# Patient Record
Sex: Female | Born: 1975 | Race: White | Hispanic: No | Marital: Single | Smoking: Current every day smoker
Health system: Southern US, Community
[De-identification: ages and names within clinical notes are randomized; demographics above are authoritative.]

## PROBLEM LIST (undated history)

## (undated) DIAGNOSIS — E119 Type 2 diabetes mellitus without complications: Secondary | ICD-10-CM

## (undated) DIAGNOSIS — I079 Rheumatic tricuspid valve disease, unspecified: Secondary | ICD-10-CM

## (undated) DIAGNOSIS — K219 Gastro-esophageal reflux disease without esophagitis: Secondary | ICD-10-CM

## (undated) DIAGNOSIS — E079 Disorder of thyroid, unspecified: Secondary | ICD-10-CM

## (undated) DIAGNOSIS — M726 Necrotizing fasciitis: Secondary | ICD-10-CM

## (undated) DIAGNOSIS — G894 Chronic pain syndrome: Secondary | ICD-10-CM

## (undated) DIAGNOSIS — G629 Polyneuropathy, unspecified: Secondary | ICD-10-CM

## (undated) HISTORY — PX: TONSILLECTOMY: SUR1361

---

## 2019-07-27 ENCOUNTER — Emergency Department (HOSPITAL_BASED_OUTPATIENT_CLINIC_OR_DEPARTMENT_OTHER): Payer: Medicaid Other

## 2019-07-27 ENCOUNTER — Other Ambulatory Visit: Payer: Self-pay

## 2019-07-27 ENCOUNTER — Emergency Department (HOSPITAL_BASED_OUTPATIENT_CLINIC_OR_DEPARTMENT_OTHER)
Admission: EM | Admit: 2019-07-27 | Discharge: 2019-07-27 | Disposition: A | Discharge status: Other Acute Inpatient Hospital | Payer: Medicaid Other | Attending: Emergency Medicine | Admitting: Emergency Medicine

## 2019-07-27 ENCOUNTER — Encounter (HOSPITAL_BASED_OUTPATIENT_CLINIC_OR_DEPARTMENT_OTHER): Payer: Self-pay | Admitting: *Deleted

## 2019-07-27 DIAGNOSIS — Z794 Long term (current) use of insulin: Secondary | ICD-10-CM | POA: Insufficient documentation

## 2019-07-27 DIAGNOSIS — M726 Necrotizing fasciitis: Secondary | ICD-10-CM | POA: Diagnosis not present

## 2019-07-27 DIAGNOSIS — M79631 Pain in right forearm: Secondary | ICD-10-CM | POA: Diagnosis present

## 2019-07-27 DIAGNOSIS — L02413 Cutaneous abscess of right upper limb: Secondary | ICD-10-CM | POA: Insufficient documentation

## 2019-07-27 DIAGNOSIS — A419 Sepsis, unspecified organism: Secondary | ICD-10-CM | POA: Insufficient documentation

## 2019-07-27 DIAGNOSIS — Z79899 Other long term (current) drug therapy: Secondary | ICD-10-CM | POA: Insufficient documentation

## 2019-07-27 DIAGNOSIS — F17200 Nicotine dependence, unspecified, uncomplicated: Secondary | ICD-10-CM | POA: Diagnosis not present

## 2019-07-27 DIAGNOSIS — L0291 Cutaneous abscess, unspecified: Secondary | ICD-10-CM

## 2019-07-27 DIAGNOSIS — Z20828 Contact with and (suspected) exposure to other viral communicable diseases: Secondary | ICD-10-CM | POA: Diagnosis not present

## 2019-07-27 DIAGNOSIS — M60031 Infective myositis, right forearm: Secondary | ICD-10-CM

## 2019-07-27 DIAGNOSIS — E119 Type 2 diabetes mellitus without complications: Secondary | ICD-10-CM | POA: Insufficient documentation

## 2019-07-27 HISTORY — DX: Necrotizing fasciitis: M72.6

## 2019-07-27 HISTORY — DX: Disorder of thyroid, unspecified: E07.9

## 2019-07-27 HISTORY — DX: Rheumatic tricuspid valve disease, unspecified: I07.9

## 2019-07-27 HISTORY — DX: Type 2 diabetes mellitus without complications: E11.9

## 2019-07-27 HISTORY — DX: Chronic pain syndrome: G89.4

## 2019-07-27 HISTORY — DX: Gastro-esophageal reflux disease without esophagitis: K21.9

## 2019-07-27 HISTORY — DX: Polyneuropathy, unspecified: G62.9

## 2019-07-27 LAB — CBC WITH DIFFERENTIAL/PLATELET
Abs Immature Granulocytes: 0.27 10*3/uL — ABNORMAL HIGH (ref 0.00–0.07)
Basophils Absolute: 0 10*3/uL (ref 0.0–0.1)
Basophils Relative: 0 %
Eosinophils Absolute: 0.1 10*3/uL (ref 0.0–0.5)
Eosinophils Relative: 1 %
HCT: 39.9 % (ref 36.0–46.0)
Hemoglobin: 13.7 g/dL (ref 12.0–15.0)
Immature Granulocytes: 1 %
Lymphocytes Relative: 6 %
Lymphs Abs: 1.2 10*3/uL (ref 0.7–4.0)
MCH: 29.8 pg (ref 26.0–34.0)
MCHC: 34.3 g/dL (ref 30.0–36.0)
MCV: 86.9 fL (ref 80.0–100.0)
Monocytes Absolute: 1.2 10*3/uL — ABNORMAL HIGH (ref 0.1–1.0)
Monocytes Relative: 6 %
Neutro Abs: 18.2 10*3/uL — ABNORMAL HIGH (ref 1.7–7.7)
Neutrophils Relative %: 86 %
Platelets: 390 10*3/uL (ref 150–400)
RBC: 4.59 MIL/uL (ref 3.87–5.11)
RDW: 12.8 % (ref 11.5–15.5)
WBC: 21 10*3/uL — ABNORMAL HIGH (ref 4.0–10.5)
nRBC: 0 % (ref 0.0–0.2)

## 2019-07-27 LAB — URINALYSIS, ROUTINE W REFLEX MICROSCOPIC
Bilirubin Urine: NEGATIVE
Glucose, UA: >=500 mg/dL — AB
Ketones, ur: NEGATIVE mg/dL
Leukocytes,Ua: NEGATIVE
Nitrite: NEGATIVE
Protein, ur: 30 mg/dL — AB
Specific Gravity, Urine: <1.005 — ABNORMAL LOW (ref 1.005–1.030)
pH: 6 (ref 5.0–8.0)

## 2019-07-27 LAB — CBG MONITORING, ED: Glucose-Capillary: 317 mg/dL — ABNORMAL HIGH (ref 70–99)

## 2019-07-27 LAB — RESPIRATORY PANEL BY RT PCR (FLU A&B, COVID)
Influenza A by PCR: NEGATIVE
Influenza B by PCR: NEGATIVE
SARS Coronavirus 2 by RT PCR: NEGATIVE

## 2019-07-27 LAB — URINALYSIS, MICROSCOPIC (REFLEX): WBC, UA: NONE SEEN WBC/hpf (ref 0–5)

## 2019-07-27 LAB — COMPREHENSIVE METABOLIC PANEL
ALT: 17 U/L (ref 0–44)
AST: 21 U/L (ref 15–41)
Albumin: 3 g/dL — ABNORMAL LOW (ref 3.5–5.0)
Alkaline Phosphatase: 123 U/L (ref 38–126)
Anion gap: 11 (ref 5–15)
BUN: 13 mg/dL (ref 6–20)
CO2: 27 mmol/L (ref 22–32)
Calcium: 8.6 mg/dL — ABNORMAL LOW (ref 8.9–10.3)
Chloride: 87 mmol/L — ABNORMAL LOW (ref 98–111)
Creatinine, Ser: 0.8 mg/dL (ref 0.44–1.00)
GFR calc Af Amer: >60 mL/min (ref >60)
GFR calc non Af Amer: >60 mL/min (ref >60)
Glucose, Bld: 447 mg/dL — ABNORMAL HIGH (ref 70–99)
Potassium: 3.4 mmol/L — ABNORMAL LOW (ref 3.5–5.1)
Sodium: 125 mmol/L — ABNORMAL LOW (ref 135–145)
Total Bilirubin: 0.5 mg/dL (ref 0.3–1.2)
Total Protein: 7.5 g/dL (ref 6.5–8.1)

## 2019-07-27 LAB — LACTIC ACID, PLASMA
Lactic Acid, Venous: 2.7 mmol/L (ref 0.5–1.9)
Lactic Acid, Venous: 2.7 mmol/L (ref 0.5–1.9)

## 2019-07-27 LAB — PROTIME-INR
INR: 0.9 (ref 0.8–1.2)
Prothrombin Time: 12.2 seconds (ref 11.4–15.2)

## 2019-07-27 LAB — SARS CORONAVIRUS 2 AG (30 MIN TAT): SARS Coronavirus 2 Ag: NEGATIVE

## 2019-07-27 LAB — PREGNANCY, URINE: Preg Test, Ur: NEGATIVE

## 2019-07-27 LAB — APTT: aPTT: 40 seconds — ABNORMAL HIGH (ref 24–36)

## 2019-07-27 MED ORDER — MORPHINE SULFATE (PF) 4 MG/ML IV SOLN
4.0000 mg | Freq: Once | INTRAVENOUS | Status: AC
  Administered 2019-07-27: 4 mg via INTRAVENOUS
  Filled 2019-07-27: qty 1

## 2019-07-27 MED ORDER — VANCOMYCIN HCL IN DEXTROSE 1-5 GM/200ML-% IV SOLN: 1000.0000 mg | Freq: Two times a day (BID) | INTRAVENOUS | Status: DC

## 2019-07-27 MED ORDER — PIPERACILLIN-TAZOBACTAM 3.375 G IVPB 30 MIN
3.3750 g | Freq: Once | INTRAVENOUS | Status: AC
  Administered 2019-07-27: 3.375 g via INTRAVENOUS
  Filled 2019-07-27 (×2): qty 50

## 2019-07-27 MED ORDER — ACETAMINOPHEN 325 MG PO TABS
650.0000 mg | ORAL_TABLET | Freq: Once | ORAL | Status: AC
  Administered 2019-07-27: 650 mg via ORAL
  Filled 2019-07-27: qty 2

## 2019-07-27 MED ORDER — VANCOMYCIN HCL IN DEXTROSE 1-5 GM/200ML-% IV SOLN: 1000.0000 mg | Freq: Once | INTRAVENOUS | Status: DC

## 2019-07-27 MED ORDER — IOHEXOL 300 MG/ML  SOLN
100.0000 mL | Freq: Once | INTRAMUSCULAR | Status: AC | PRN
  Administered 2019-07-27: 18:00:00 100 mL via INTRAVENOUS

## 2019-07-27 MED ORDER — PIPERACILLIN-TAZOBACTAM 3.375 G IVPB: 3.3750 g | Freq: Three times a day (TID) | INTRAVENOUS | Status: DC

## 2019-07-27 MED ORDER — CLINDAMYCIN PHOSPHATE 900 MG/50ML IV SOLN
900.0000 mg | Freq: Once | INTRAVENOUS | Status: AC
  Administered 2019-07-27: 900 mg via INTRAVENOUS
  Filled 2019-07-27: qty 50

## 2019-07-27 MED ORDER — VANCOMYCIN HCL IN DEXTROSE 1-5 GM/200ML-% IV SOLN
1000.0000 mg | Freq: Once | INTRAVENOUS | Status: AC
  Administered 2019-07-27: 1000 mg via INTRAVENOUS
  Filled 2019-07-27: qty 200

## 2019-07-27 MED ORDER — VANCOMYCIN HCL 10 G IV SOLR
1250.0000 mg | Freq: Once | INTRAVENOUS | Status: DC
  Filled 2019-07-27: qty 1250

## 2019-07-27 MED ORDER — LACTATED RINGERS IV BOLUS (SEPSIS)
1000.0000 mL | Freq: Once | INTRAVENOUS | Status: AC
  Administered 2019-07-27: 1000 mL via INTRAVENOUS

## 2019-07-27 NOTE — ED Provider Notes (Signed)
[facility] EMERGENCY DEPARTMENT Provider Note   CSN: [id number] Arrival date & time: 07/27/19  1603     History No chief complaint on file.   Alexandra Rich is a 43 y.o. female.  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   Reports she scratched her right forearm on briars on Thursday. Friday, developed worsening pain and redness to the area which has significantly worsened.  Pain is severe, 10/10, worse with movement and palpation.  No fevers at home but reports chills, feeling cold.  No n/v/cough or other symptoms. Is having difficulty moving her right hand but cannot say for how long, reports has been progressive as pain and swelling have increased.  Reports she did not want to come to the ED as she does not want to be admitted and does not want to end up in a SNF again on IV antibiotics.  Glucose have been running high for last several days.      Past Medical History:  Diagnosis Date  . Chronic pain syndrome   . Diabetes mellitus without complication (HCC)   . Endocarditis of tricuspid valve   . GERD (gastroesophageal reflux disease)   . Necrotizing fasciitis (HCC)   . Neuropathy   . Thyroid disease     There are no problems to display for this patient.   Past Surgical History:  Procedure Laterality Date  . TONSILLECTOMY       OB History   No obstetric history on file.     No family history on file.  Social History   Tobacco Use  . Smoking status: Current Every Day Smoker  . Smokeless tobacco: Never Used    Substance Use Topics  . Alcohol use: Yes  . Drug use: Never    Home Medications Prior to Admission medications   Medication Sig Start Date End Date Taking? Authorizing Provider  atorvastatin (LIPITOR) 80 MG tablet Take by mouth. 10/16/17  Yes [provider]  fenofibrate (TRICOR) 145 MG tablet Take by mouth. 10/16/17  Yes [provider]  insulin aspart (NOVOLOG) 100 UNIT/ML injection 100 units MDD via VGo 04/01/19  Yes [provider]  levothyroxine (SYNTHROID) 125 MCG tablet TAKE 1 TABLET BY MOUTH EVERY DAY 04/29/19  Yes [provider]  pantoprazole (PROTONIX) 20 MG tablet TAKE 1 TABLET TWICE DAILY. 02/05/15  Yes [provider]    Allergies    Aspirin  Review of Systems   Review of Systems  Constitutional: Positive for chills. Negative for fever.  HENT: Negative for sore throat.   Eyes: Negative for visual disturbance.  Respiratory: Negative for cough and shortness of breath.   Cardiovascular: Negative for chest pain.  Gastrointestinal: Negative for abdominal pain, diarrhea, nausea and vomiting.  Genitourinary: Negative for difficulty urinating and dysuria.  Musculoskeletal: Positive for arthralgias. Negative for back pain and neck pain.  Skin: Positive for color change, rash and wound.  Neurological: Negative for syncope, weakness, numbness and headaches.    Physical Exam Updated Vital Signs BP (!) 166/87   Pulse 96   Temp (!) 100.4 F (38 C)   Resp 16  Ht  (1.727 m)   Wt 64.9 kg   SpO2 97%   BMI 21.74 kg/m   Physical Exam Vitals and nursing note reviewed.  Constitutional:      General: She is not in acute distress.    Appearance: She is well-developed. She is not diaphoretic.  HENT:     Head: Normocephalic and atraumatic.  Eyes:     Conjunctiva/sclera: Conjunctivae normal.  Cardiovascular:     Rate and Rhythm: Normal rate and regular rhythm.  Pulmonary:     Effort: Pulmonary effort is normal. No respiratory  distress.  Musculoskeletal:        General: No tenderness.     Cervical back: Normal range of motion.     Comments: Right upper extremity with erythema extending from elbow to wrist on dorsum of arm, edema noted to volar forearm with edema extending into fingers, pain with passive movement of fingers and fingers held in flexion. Reports she is unable to flex or extend fingers, can extend/flex and elbow Severe tenderness. No crepitus on exam, no focal areas of fluctuance, diffuse edema and tenderness.  Reports normal sensation, pulses 2+, normal cap refill.   Skin:    General: Skin is warm and dry.     Findings: Erythema present. No rash. Abrasion: superficial healing scratch noted to volar proximal forearm.  Neurological:     Mental Status: She is alert and oriented to person, place, and time.       ED Results / Procedures / Treatments   Labs (all labs ordered are listed, but only abnormal results are displayed) Labs Reviewed  LACTIC ACID, PLASMA - Abnormal; Notable for the following components:      Result Value   Lactic Acid, Venous 2.7 (*)    All other components within normal limits  LACTIC ACID, PLASMA - Abnormal; Notable for the following components:   Lactic Acid, Venous 2.7 (*)    All other components within normal limits  COMPREHENSIVE METABOLIC PANEL - Abnormal; Notable for the following components:   Sodium 125 (*)    Potassium 3.4 (*)    Chloride 87 (*)    Glucose, Bld 447 (*)    Calcium 8.6 (*)    Albumin 3.0 (*)    All other components within normal limits  CBC WITH DIFFERENTIAL/PLATELET - Abnormal; Notable for the following components:   WBC 21.0 (*)    Neutro Abs 18.2 (*)    Monocytes Absolute 1.2 (*)    Abs Immature Granulocytes 0.27 (*)    All other components within normal limits  APTT - Abnormal; Notable for the following components:   aPTT 40 (*)    All other components within normal limits  URINALYSIS, ROUTINE W REFLEX MICROSCOPIC - Abnormal; Notable  for the following components:   Specific Gravity, Urine <1.005 (*)    Glucose, UA >=500 (*)    Hgb urine dipstick SMALL (*)    Protein, ur 30 (*)    All other components within normal limits  URINALYSIS, MICROSCOPIC (REFLEX) - Abnormal; Notable for the following components:   Bacteria, UA RARE (*)    All other components within normal limits  CBG MONITORING, ED - Abnormal; Notable for the following components:   Glucose-Capillary 317 (*)    All other components within normal limits  RESPIRATORY PANEL BY RT PCR (FLU A&B, COVID)  SARS CORONAVIRUS 2 AG (30 MIN TAT)  CULTURE, BLOOD (ROUTINE X 2)  CULTURE, BLOOD (ROUTINE X 2)  URINE CULTURE  PROTIME-INR  PREGNANCY, URINE    EKG EKG Interpretation  Date/Time:  Monday July 27 2019 16:29:31 EST Ventricular Rate:  97 PR Interval:    QRS Duration: 95 QT Interval:  370 QTC Calculation: 473 R Axis:   -42 Text Interpretation: Sinus rhythm Left ventricular hypertrophy Probable anterior infarct, age indeterminate No previous ECGs available Confirmed by Gareth Morgan [id number]) on 07/27/2019 7:05:41 PM   Radiology CT FOREARM RIGHT W CONTRAST  Result Date: 07/27/2019 CLINICAL DATA:  Soft tissue infection suspected, history of right upper extremity necrotizing fasciitis/deep space infection, now with right upper extremity pain and swelling EXAM: CT OF THE UPPER RIGHT EXTREMITY WITH CONTRAST TECHNIQUE: Multidetector CT imaging of the upper right extremity was performed according to the standard protocol following intravenous contrast administration. COMPARISON:  None. CONTRAST:  134mL OMNIPAQUE IOHEXOL 300 MG/ML  SOLN FINDINGS: Bones/Joint/Cartilage No acute bony abnormality. Specifically, no fracture, subluxation, or dislocation. No erosive or destructive changes. No abnormal cortical thickening or erosion, periostitis, or destructive change to suggest CT features of osteomyelitis. A right elbow joint effusion is noted. Ligaments Suboptimally  assessed by CT. Muscles and Tendons There is irregular thickening and hypoattenuation of the musculature with extensive intrafascial and intramuscular foci of gas and fluid concerning for deep space infection and potential abscess development predominantly within the superficial and deep volar muscular compartments. Additional fluid and stranding is seen extending along the soft tissues in the included musculature of the flexor and extensor compartments of the hand. Soft tissues There is circumferential soft tissue swelling and edema with the extensive intramuscular and soft tissue gas, as detailed above. Subcutaneous gas is noted along the ulnar aspect of the proximal forearm. Vessels appear normally opacified. Additional edematous fluid in the olecranon bursa and antecubital fossa. Milder edematous changes noted of the distal upper arm. IMPRESSION: 1. Findings concerning for aggressive soft tissue infection/necrotizing fasciitis and intramuscular abscess development predominantly within the superficial and deep volar muscular compartments of the forearm as well as additional soft tissue gas in the medial subcutaneous tissues. Edema and overlying cellulitis extend throughout the soft tissues of the hand as included and into the distal upper arm. 2. Right elbow joint effusion common sterility not ascertained on imaging though worrisome for potential septic arthritis. 3. No convincing CT evidence for osteomyelitis at this time. Critical Value/emergent results were called by telephone at the time of interpretation on 07/27/2019 at 7:17 pm to [place] , who verbally acknowledged these results. Electronically Signed   By: Lovena Le M.D.   On: 07/27/2019 19:17    Procedures .Critical Care Performed by: Gareth Morgan, MD Authorized by: Gareth Morgan, MD   Critical care provider statement:    Critical care time (minutes):  90   Critical care was time spent personally by me on the following  activities:  Discussions with consultants, evaluation of patient's response to treatment, examination of patient, ordering and performing treatments and interventions, ordering and review of laboratory studies, ordering and review of radiographic studies, pulse oximetry, re-evaluation of patient's condition, obtaining history from patient or surrogate and review of old charts   (including critical care time)  Medications Ordered in ED Medications  piperacillin-tazobactam (ZOSYN) IVPB 3.375 g (has no administration in time range)  vancomycin (VANCOCIN) IVPB 1000 mg/200 mL premix (has no administration in time range)  lactated ringers bolus 1,000 mL (0 mLs Intravenous Stopped 07/27/19 2038)  clindamycin (CLEOCIN) IVPB 900 mg ( Intravenous Stopped 07/27/19 1939)  morphine 4 MG/ML injection 4 mg (4 mg Intravenous Given  07/27/19 1734)  piperacillin-tazobactam (ZOSYN) IVPB 3.375 g (0 g Intravenous Stopped 07/27/19 1827)  vancomycin (VANCOCIN) IVPB 1000 mg/200 mL premix (0 mg Intravenous Stopped 07/27/19 2137)  iohexol (OMNIPAQUE) 300 MG/ML solution 100 mL (100 mLs Intravenous Contrast Given 07/27/19 1819)  acetaminophen (TYLENOL) tablet 650 mg (650 mg Oral Given 07/27/19 2259)    ED Course  I have reviewed the triage vital signs and the nursing notes.  Pertinent labs & imaging results that were available during my care of the patient were reviewed by me and considered in my medical decision making (see chart for details).    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  Vital signs normal on arrival and no crepitus noted on exam, however given degree of pain and history of necrotizing infection consider repeat of polymycrobial necrotizing infection, versus other cellulitis or deep abscess formation.  Given hx, ordered empiric vanc/zosyn/clindamycin and plan to obtain CT for further evaluation. Compartments do appear soft at this time, however feel she is at risk of developing compartment syndrome.  Discussed on patient arrival that I would like to admit her given degree of infection, prior hx, DM, and would like to transfer her to [facility] for evaluation by her Orthopedic providers however she is reluctant at this time.  Labs obtained show [place]ocytosis of [address], hyperglycemia with corrected sodium of 131-133.  Lactic acid 2.7.  CT shows aggressive soft tissue infection/necrotizing fasciitis and intramuscular abscess development within the superficial and deep volar muscle compartments of the forearm as well as additional soft tissue gas in the medial subcutaneous tissues, elbow joint effusion, tenosynovitis.  Vital signs remained stable.  Initially spoke with Dr. [facility]rena [facility] and [facility] given patient's history of similar infection of the LUE treated there. Dr. Andrena Mews reports they do not have beds but would accept her when they do.    Discussed with hand surgery Dr. Janee Morn who reviewed images and called Dr. Andrena Mews and [facility] regarding patient.  While he can provide initial care tonight in case of emergency and no options for transfer, will not be able to provide definitive care and recommends tertiary center for definitive care of significant forearm soft tissue infection.  Called both [place] and [place].  [facility] returned call to Dr. Janee Morn and report they have made calls and are now able to accomodate her at [facility]. Plan on ED to ED transfer with Orthopedics Dr. Andrena Mews accepting.    She remains hemodynamically  stable prior to transfer, is NPO, COVID19 testing negative.    Final Clinical Impression(s) / ED Diagnoses Final diagnoses:  Necrotizing fasciitis (HCC)  Abscess  Infective myositis of right forearm  Sepsis without acute organ dysfunction, due to unspecified organism [facility])    Rx / DC Orders ED Discharge Orders    None       Alvira Monday, MD 07/28/19 0025

## 2019-07-27 NOTE — ED Notes (Signed)
[facility] notified (Taryn) - patient ready for transport 

## 2019-07-27 NOTE — ED Notes (Signed)
Contacted [facility] @ for transfer for ortho

## 2019-07-27 NOTE — Progress Notes (Signed)
Pharmacy Antibiotic Note  Alexandra Rich is a 43 y.o. female admitted on 07/27/2019 with cellulitis. 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  Pharmacy has been consulted for vancomycin/Zosyn dosing. Afebrile, WBC 21, LA 2.7. SCr 0.8 on admit. Patient received vancomycin 1g IV x 1 and clindamycin x 1 dose per EDP.  Plan: Zosyn 3.375g IV (5min infusion) x1; then 3.375g IV q8h (4h infusion) Continue vancomycin 1000 mg IV Q 12 hrs. Goal AUC 400-550. Expected AUC: 538 SCr used: 0.8 Monitor clinical progress, c/s, renal function F/u de-escalation plan/LOT, vancomycin levels as indicated   Height: 5\' 8"  (172.7 cm) Weight: 143 lb (64.9 kg) IBW/kg (Calculated) : 63.9  Temp (24hrs), Avg:98.5 F (36.9 C), Min:98.5 F (36.9 C), Max:98.5 F (36.9 C)  No results for input(s): WBC, CREATININE, LATICACIDVEN, VANCOTROUGH, VANCOPEAK, VANCORANDOM, GENTTROUGH, GENTPEAK, GENTRANDOM, TOBRATROUGH, TOBRAPEAK, TOBRARND, AMIKACINPEAK, AMIKACINTROU, AMIKACIN in the last 168 hours.  CrCl cannot be calculated (No successful lab value found.).    Allergies  Allergen Reactions  . Aspirin     swelling    Antimicrobials this admission: 12/14 vancomycin >>  12/14 Zosyn >>  12/14 Clinda x 1  Dose adjustments this admission:   Microbiology results:   Elicia Lamp, PharmD, BCPS Clinical Pharmacist 07/27/2019 5:01 PM

## 2019-07-27 NOTE — ED Triage Notes (Signed)
Swelling to her right arm. States it started after getting scratches from tree limbs. States in March she had "flesh eating" infection in her left arm.

## 2019-07-27 NOTE — ED Notes (Signed)
Contacted Dr. Aluisio for ortho consult 

## 2019-07-27 NOTE — ED Notes (Addendum)
Contacted [facility]) - patient ready for transport [facility] will pick up patient - cancelled [facility] transport

## 2019-07-27 NOTE — ED Notes (Signed)
Patient transported to CT 

## 2019-07-27 NOTE — ED Notes (Signed)
[facility] @ [phone]

## 2019-07-27 NOTE — ED Notes (Signed)
Cancelled transfer with [facility]) [phone]

## 2019-07-27 NOTE — ED Notes (Signed)
Contacted [facility] for ortho consult @ [phone] - faxed facesheet to [phone]

## 2019-07-29 LAB — URINE CULTURE: Culture: 30000 — AB

## 2019-08-01 LAB — CULTURE, BLOOD (ROUTINE X 2)
Culture: NO GROWTH
Special Requests: ADEQUATE
Special Requests: ADEQUATE

## 2020-10-05 IMAGING — CT CT FOREARM*R* W/CM
3 series · 11 of 35 positions shown, 12 images · IV contrast (agent unspecified)
Comparison: None.

CONTRAST:  100mL OMNIPAQUE IOHEXOL 300 MG/ML  SOLN

CLINICAL DATA: Soft tissue infection suspected, history of right
upper extremity necrotizing fasciitis/deep space infection, now with
right upper extremity pain and swelling

EXAM:
CT OF THE UPPER RIGHT EXTREMITY WITH CONTRAST
TECHNIQUE: Multidetector CT imaging of the upper right extremity was performed
according to the standard protocol following intravenous contrast
administration.

[Series 7: ax st · axial · 0.39mm/px · z∈[-619,-514]mm · 2 of 250 slices shown, 3 images]
[im 77/250  soft-tissue]
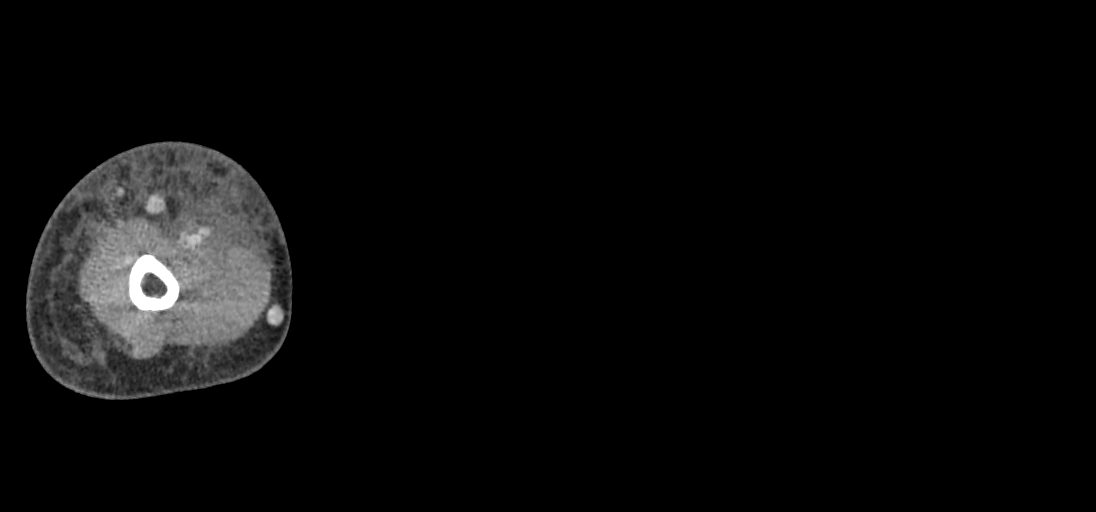
[im 77/250  bone]
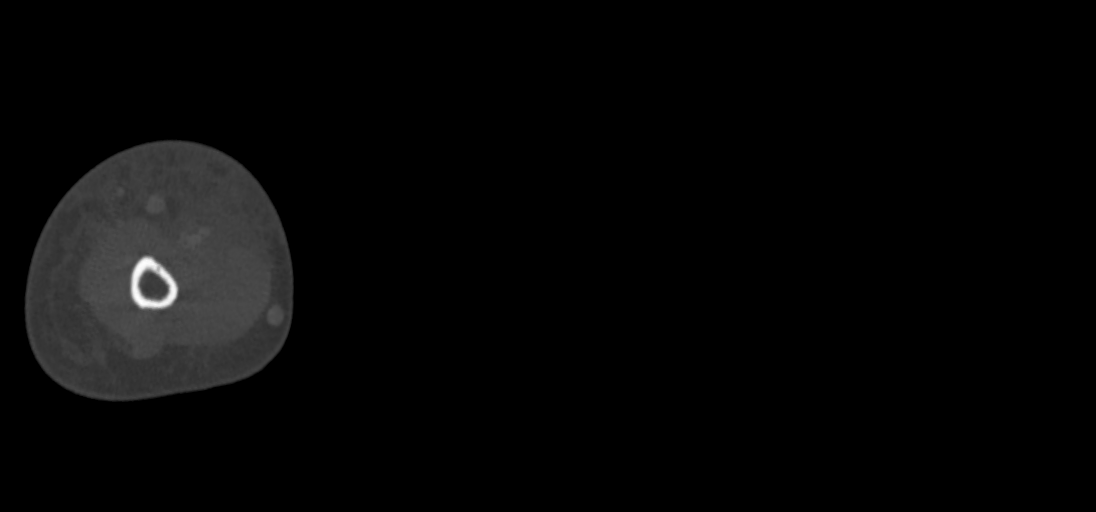
[im 192/250  bone]
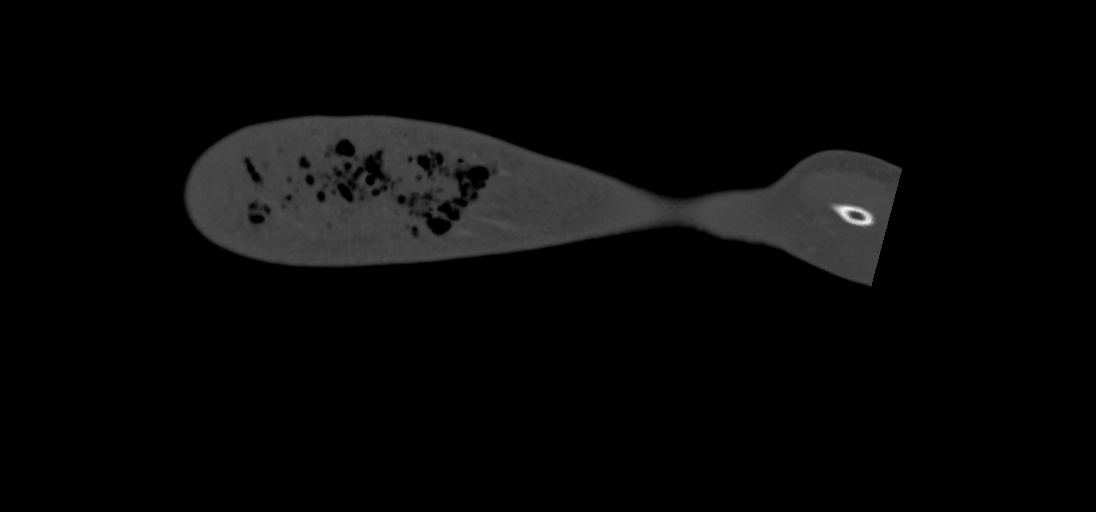

[Series 8: cor st · coronal · 0.49mm/px · 3 of 196 slices shown]
[im 40/196  bone]
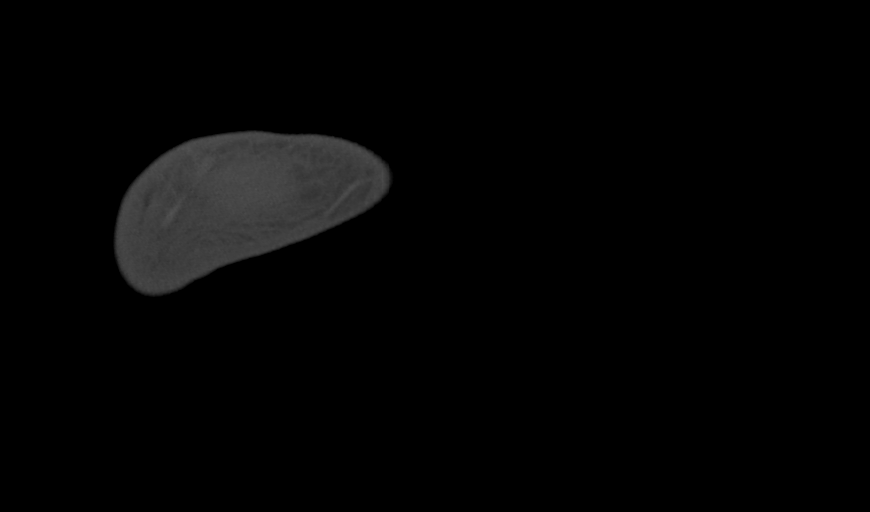
[im 79/196  bone]
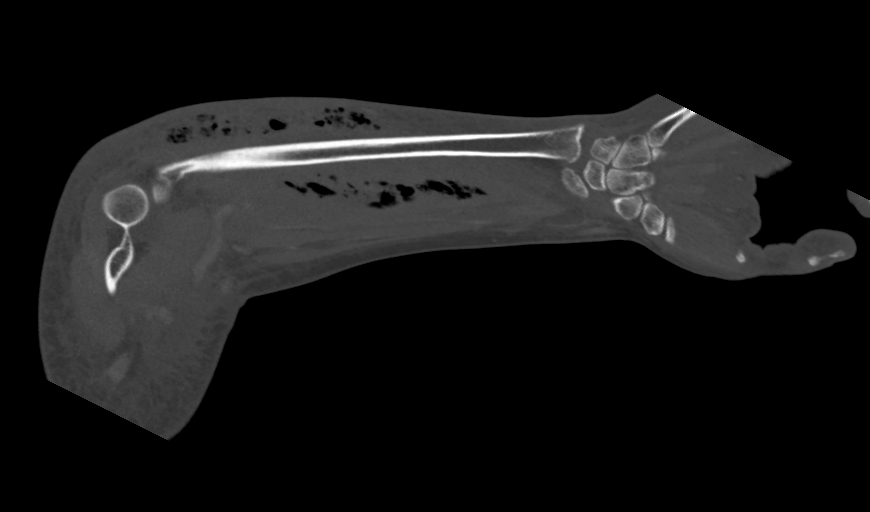
[im 118/196  bone]
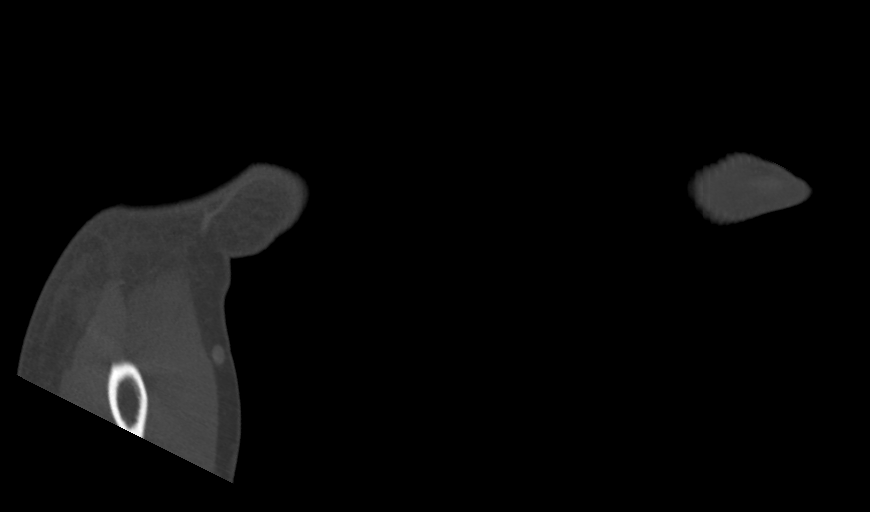

[Series 9: sag st · sagittal · 0.39mm/px · 6 of 394 slices shown]
[im 185/394  bone]
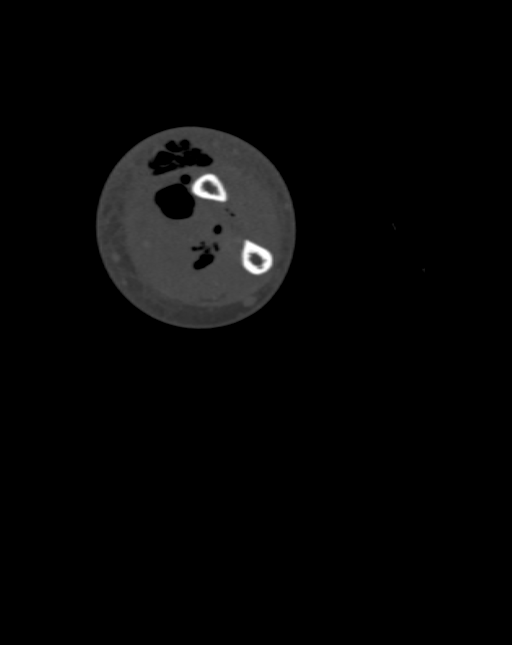
[im 194/394  bone]
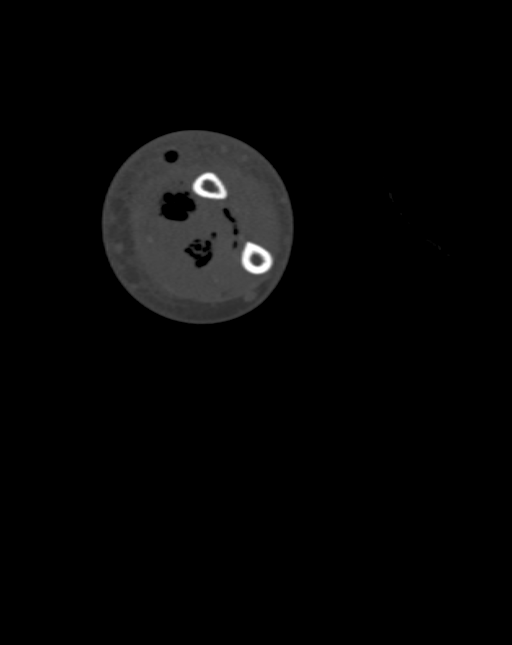
[im 204/394  bone]
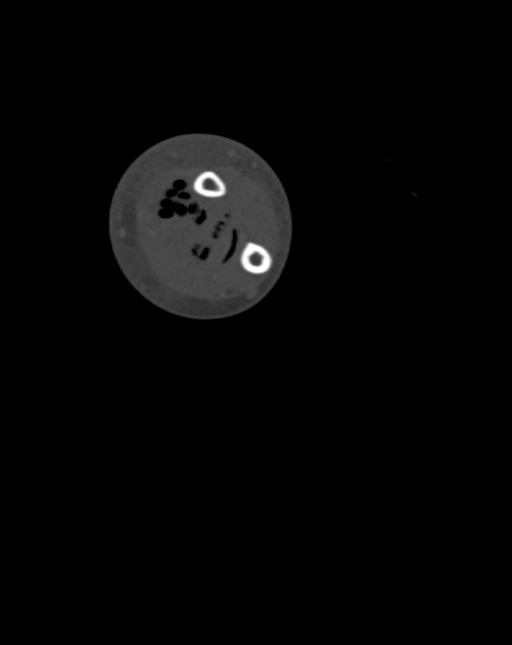
[im 213/394  soft-tissue]
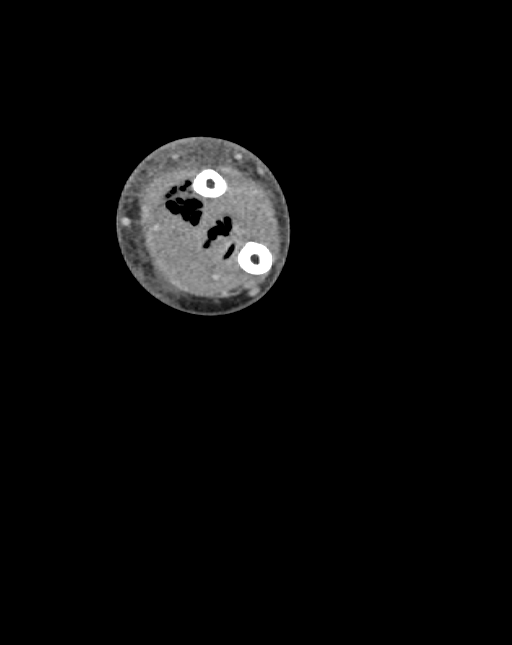
[im 214/394  bone]
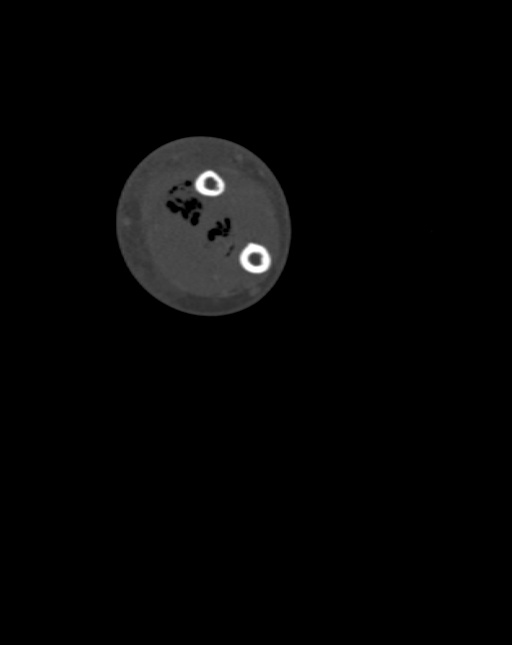
[im 224/394  bone]
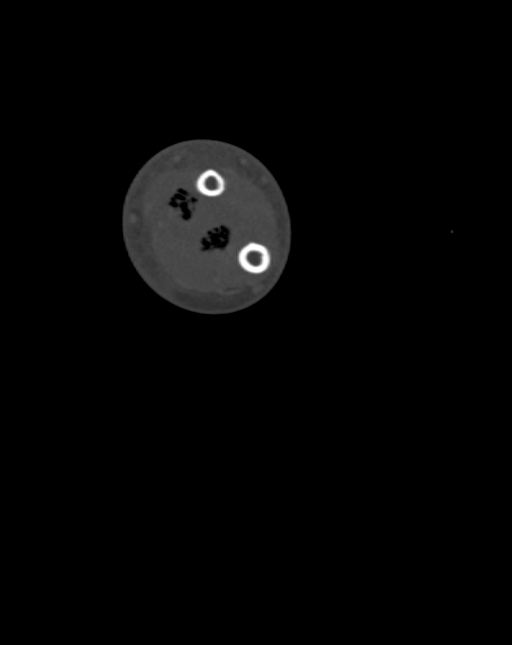

[11 of 35 positions shown; findings below may reference images not displayed]

FINDINGS: Bones/Joint/Cartilage

No acute bony abnormality. Specifically, no fracture, subluxation,
or dislocation. No erosive or destructive changes. No abnormal
cortical thickening or erosion, periostitis, or destructive change
to suggest CT features of osteomyelitis. A right elbow joint
effusion is noted.

Ligaments

Suboptimally assessed by CT.

Muscles and Tendons

There is irregular thickening and hypoattenuation of the musculature
with extensive intrafascial and intramuscular foci of gas and fluid
concerning for deep space infection and potential abscess
development predominantly within the superficial and deep volar
muscular compartments. Additional fluid and stranding is seen
extending along the soft tissues in the included musculature of the
flexor and extensor compartments of the hand.

Soft tissues

There is circumferential soft tissue swelling and edema with the
extensive intramuscular and soft tissue gas, as detailed above.
Subcutaneous gas is noted along the ulnar aspect of the proximal
forearm. Vessels appear normally opacified. Additional edematous
fluid in the olecranon bursa and antecubital fossa. Milder edematous
changes noted of the distal upper arm.
IMPRESSION: 1. Findings concerning for aggressive soft tissue
infection/necrotizing fasciitis and intramuscular abscess
development predominantly within the superficial and deep volar
muscular compartments of the forearm as well as additional soft
tissue gas in the medial subcutaneous tissues. Edema and overlying
cellulitis extend throughout the soft tissues of the hand as
included and into the distal upper arm.
2. Right elbow joint effusion common sterility not ascertained on
imaging though worrisome for potential septic arthritis.
3. No convincing CT evidence for osteomyelitis at this time.

Critical Value/emergent results were called by telephone at the time
of interpretation on 07/27/2019 at [DATE] to [place] , who verbally acknowledged these results.
